# Patient Record
Sex: Male | Born: 1995 | Race: White | Hispanic: No | Marital: Single | Smoking: Never smoker
Health system: Southern US, Community
[De-identification: ages and names within clinical notes are randomized; demographics above are authoritative.]

---
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
## 2016-05-18 ENCOUNTER — Other Ambulatory Visit: Payer: Self-pay | Admitting: Student

## 2016-05-18 DIAGNOSIS — K824 Cholesterolosis of gallbladder: Secondary | ICD-10-CM

## 2016-05-26 ENCOUNTER — Ambulatory Visit: Admission: RE | Admit: 2016-05-26 | Source: Ambulatory Visit

## 2016-05-30 IMAGING — US US ABDOMEN LIMITED
1 series · 14 of 25 positions shown · non-contrast
Comparison: 05/17/2015

CLINICAL DATA: Elevated liver enzymes with right upper quadrant
pain

EXAM:
US ABDOMEN LIMITED - RIGHT UPPER QUADRANT

[Series 1: us abdomen limited · 0.17mm/px · 14 of 58 slices shown]
[im 1/58]
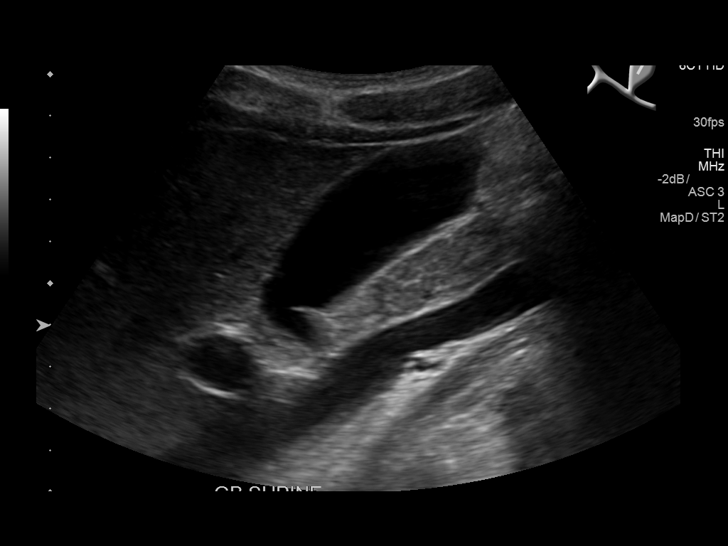
[im 5/58]
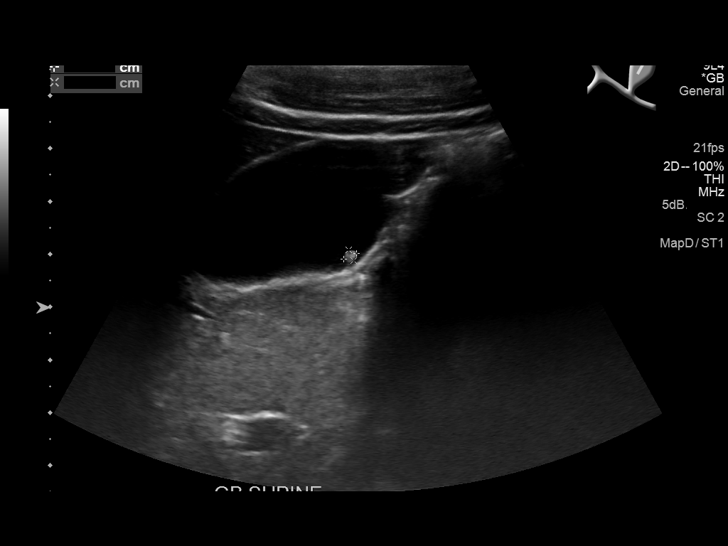
[im 10/58]
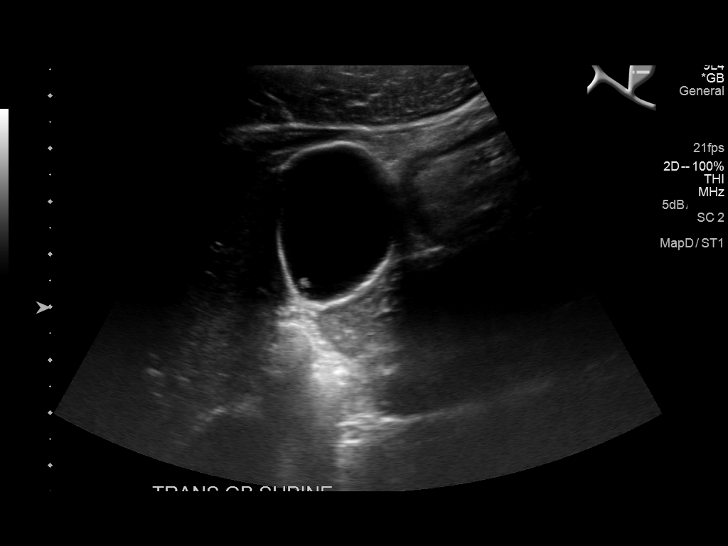
[im 15/58]
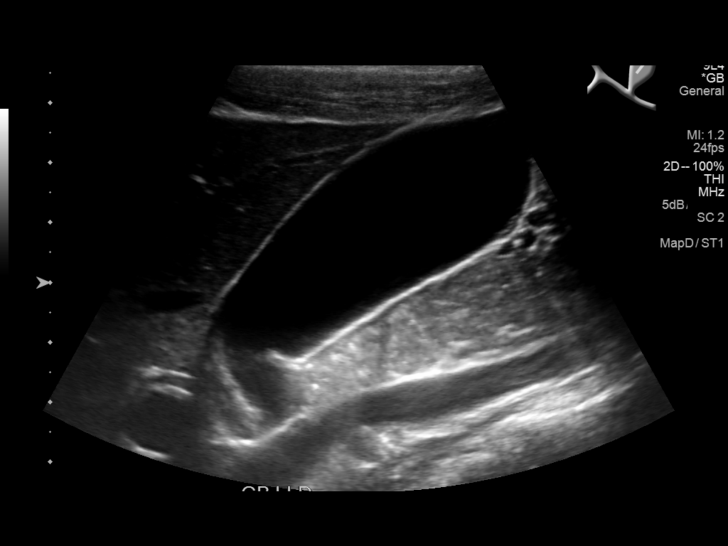
[im 20/58]
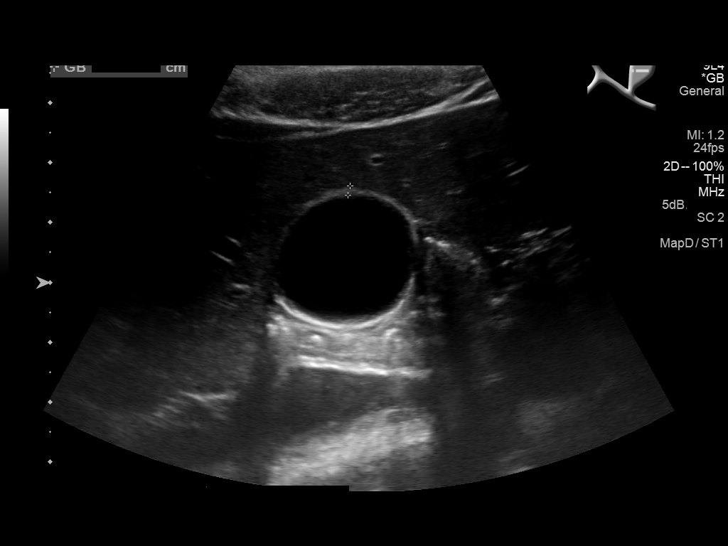
[im 22/58]
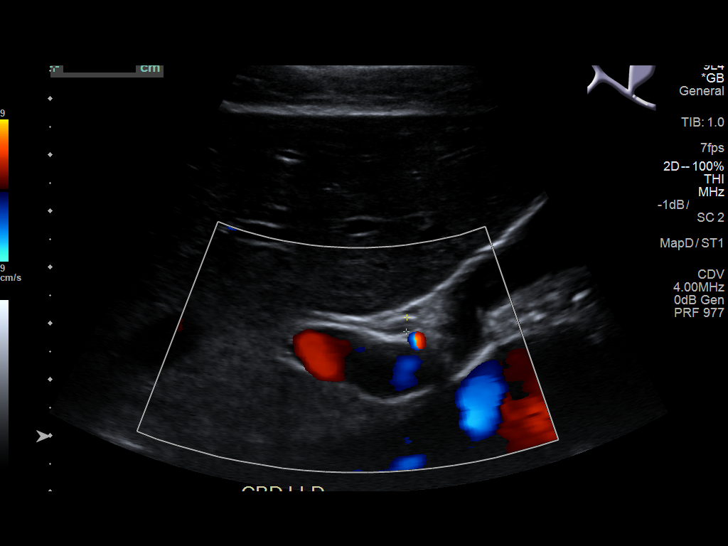
[im 27/58]
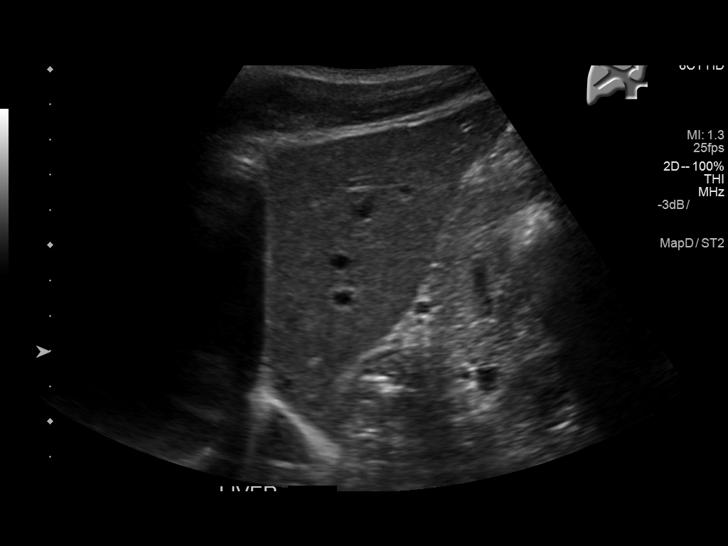
[im 31/58]
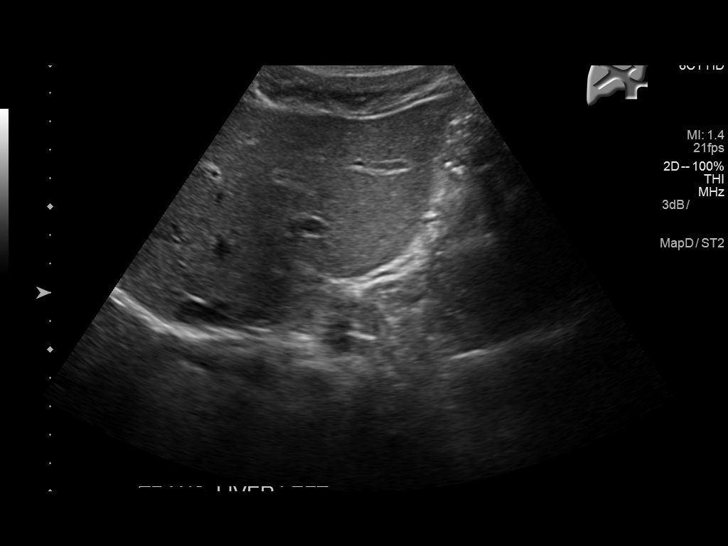
[im 36/58]
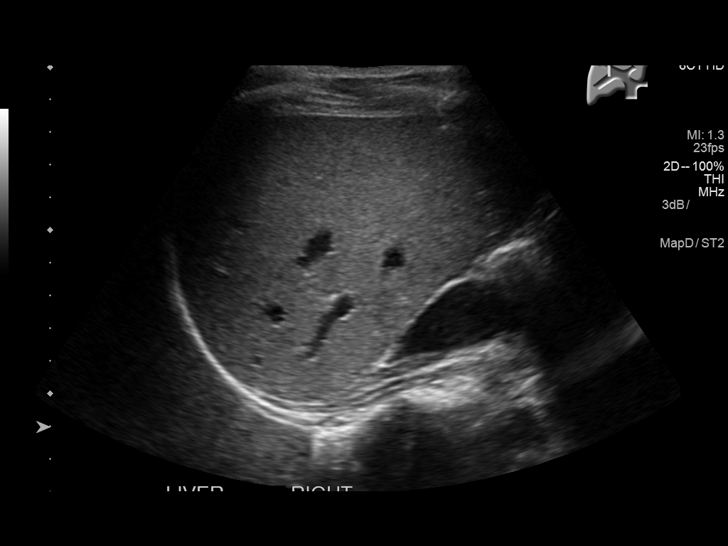
[im 39/58]
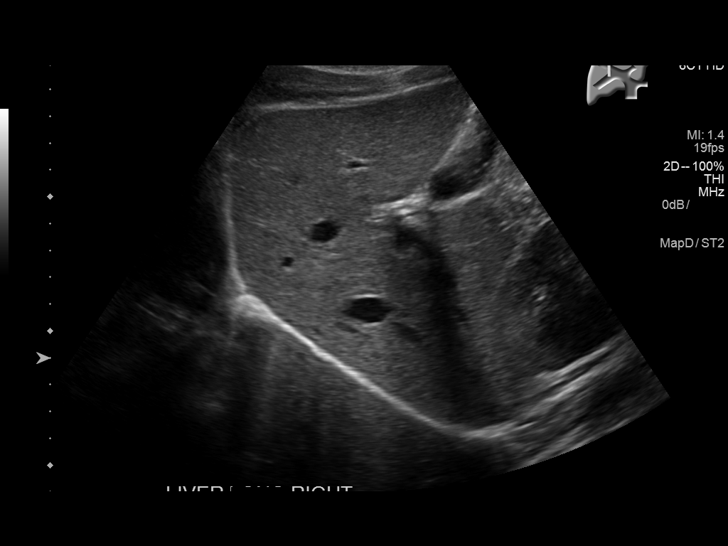
[im 43/58]
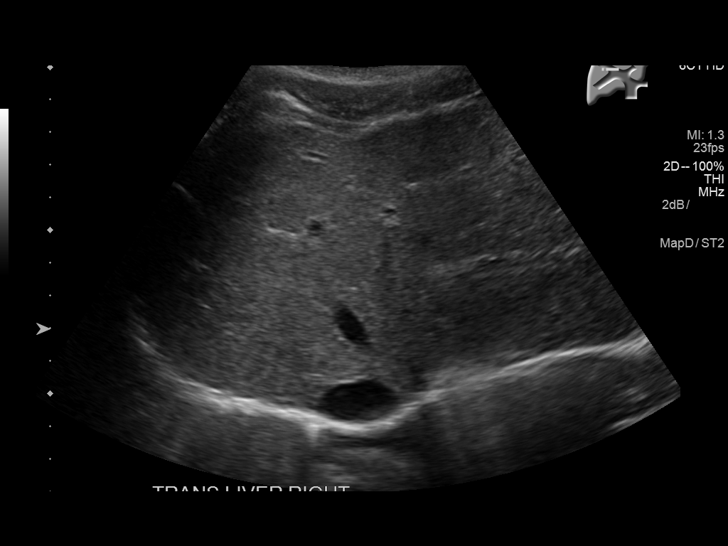
[im 48/58]
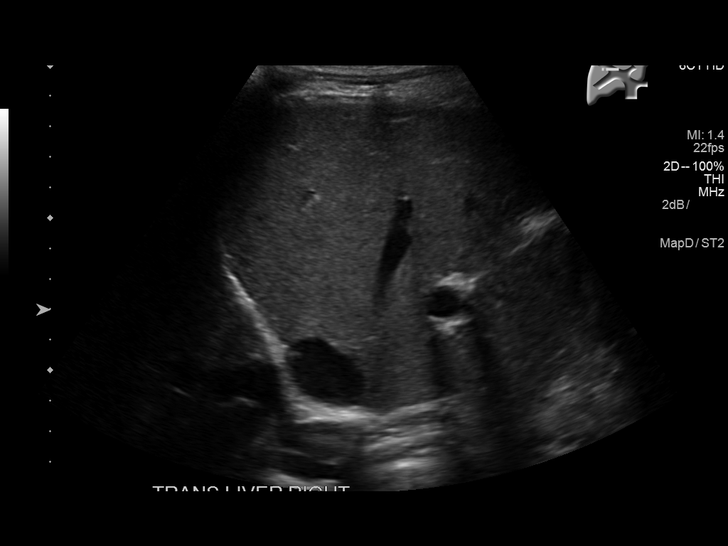
[im 53/58]
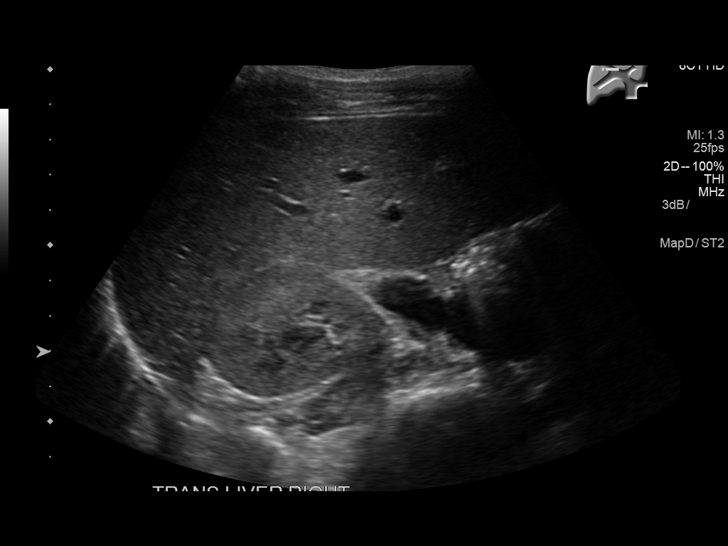
[im 58/58]
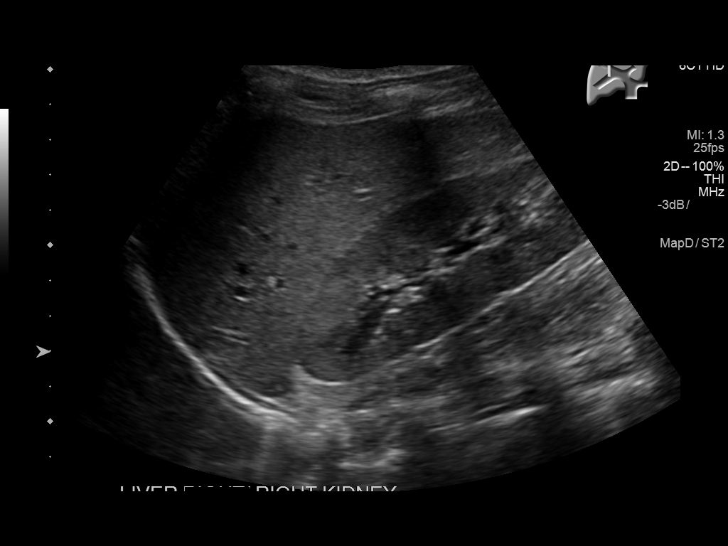

[14 of 25 positions shown; findings below may reference images not displayed]

FINDINGS: Gallbladder:

Small gallbladder polyp is again identified and stable. No wall
thickening or pericholecystic fluid is noted. No calculi are seen.

Common bile duct:

Diameter: 2.5 mm.

Liver:

No focal lesion identified. Within normal limits in parenchymal
echogenicity.
IMPRESSION: Stable gallbladder polyp.  No acute abnormality is noted.

## 2016-12-18 ENCOUNTER — Encounter: Payer: Self-pay | Admitting: Emergency Medicine

## 2016-12-18 ENCOUNTER — Emergency Department
Admission: EM | Admit: 2016-12-18 | Discharge: 2016-12-18 | Disposition: A | Discharge status: Home / Self Care | Attending: Emergency Medicine | Admitting: Emergency Medicine

## 2016-12-18 DIAGNOSIS — T675XXA Heat exhaustion, unspecified, initial encounter: Secondary | ICD-10-CM

## 2016-12-18 DIAGNOSIS — R1110 Vomiting, unspecified: Secondary | ICD-10-CM | POA: Insufficient documentation

## 2016-12-18 DIAGNOSIS — R197 Diarrhea, unspecified: Secondary | ICD-10-CM | POA: Diagnosis not present

## 2016-12-18 DIAGNOSIS — R112 Nausea with vomiting, unspecified: Secondary | ICD-10-CM

## 2016-12-18 LAB — COMPREHENSIVE METABOLIC PANEL
ALT: 15 U/L — AB (ref 17–63)
AST: 24 U/L (ref 15–41)
Albumin: 5.1 g/dL — ABNORMAL HIGH (ref 3.5–5.0)
Alkaline Phosphatase: 69 U/L (ref 38–126)
Anion gap: 11 (ref 5–15)
BUN: 16 mg/dL (ref 6–20)
CHLORIDE: 101 mmol/L (ref 101–111)
CO2: 28 mmol/L (ref 22–32)
CREATININE: 0.93 mg/dL (ref 0.61–1.24)
Calcium: 9.7 mg/dL (ref 8.9–10.3)
GFR calc non Af Amer: >60 mL/min (ref >60)
Glucose, Bld: 111 mg/dL — ABNORMAL HIGH (ref 65–99)
Potassium: 4.5 mmol/L (ref 3.5–5.1)
SODIUM: 140 mmol/L (ref 135–145)
Total Bilirubin: 3.7 mg/dL — ABNORMAL HIGH (ref 0.3–1.2)
Total Protein: 8.1 g/dL (ref 6.5–8.1)

## 2016-12-18 LAB — CBC
HEMATOCRIT: 48.3 % (ref 40.0–52.0)
HEMOGLOBIN: 16.6 g/dL (ref 13.0–18.0)
MCH: 30.4 pg (ref 26.0–34.0)
MCHC: 34.4 g/dL (ref 32.0–36.0)
MCV: 88.5 fL (ref 80.0–100.0)
PLATELETS: 240 10*3/uL (ref 150–440)
RBC: 5.45 MIL/uL (ref 4.40–5.90)
RDW: 13.3 % (ref 11.5–14.5)
WBC: 13.8 10*3/uL — ABNORMAL HIGH (ref 3.8–10.6)

## 2016-12-18 LAB — LIPASE, BLOOD: LIPASE: 21 U/L (ref 11–51)

## 2016-12-18 MED ORDER — DICYCLOMINE HCL 20 MG PO TABS: 20.0000 mg | ORAL_TABLET | Freq: Three times a day (TID) | ORAL | 0 refills | Status: DC | PRN

## 2016-12-18 MED ORDER — FAMOTIDINE 20 MG PO TABS: 20.0000 mg | ORAL_TABLET | Freq: Two times a day (BID) | ORAL | 1 refills | Status: DC

## 2016-12-18 MED ORDER — ONDANSETRON 4 MG PO TBDP: 4.0000 mg | ORAL_TABLET | Freq: Three times a day (TID) | ORAL | 0 refills | Status: DC | PRN

## 2016-12-18 MED ORDER — SUCRALFATE 1 G PO TABS: 1.0000 g | ORAL_TABLET | Freq: Four times a day (QID) | ORAL | 1 refills | Status: AC

## 2016-12-18 MED ORDER — ONDANSETRON HCL 4 MG/2ML IJ SOLN
4.0000 mg | Freq: Once | INTRAMUSCULAR | Status: AC
  Administered 2016-12-18: 4 mg via INTRAVENOUS
  Filled 2016-12-18: qty 2

## 2016-12-18 MED ORDER — SODIUM CHLORIDE 0.9 % IV SOLN
Freq: Once | INTRAVENOUS | Status: AC
  Administered 2016-12-18: 22:00:00 via INTRAVENOUS

## 2016-12-18 NOTE — ED Triage Notes (Signed)
Pt arrived to the ED for complaints of NVD starting today after being working outside in the sun all day. Pt states that he is not able to keep anything down and having diarrhea. Pt is AOx4 in no apparent distress.

## 2016-12-18 NOTE — ED Provider Notes (Signed)
[facility] Emergency Department Provider Note       Time seen: ----------------------------------------- 10:08 PM on 12/18/2016 -----------------------------------------     I have reviewed the triage vital signs and the nursing notes.   HISTORY   Chief Complaint Emesis and Diarrhea    HPI Troy Hanson is a 21 y.o. male who presents to the ED for comparison nausea, vomiting and diarrhea that started today after being outside in the sun all day. 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 Patient states he feels dehydrated. Typically has these symptoms twice a week but it is worse than normal today. He has seen gastroenterology in the past but was not given a specific diagnosis.   History reviewed. No pertinent past medical history.  There are no active problems to display for this patient.   History reviewed. No pertinent surgical history.  Allergies Patient has no known allergies.  Social History Social History  Substance Use Topics  . Smoking status: Never Smoker  . Smokeless tobacco: Never Used  . Alcohol use No    Review of Systems Constitutional: Negative for fever. Eyes: Negative for vision changes ENT:  Negative for congestion, sore throat Cardiovascular: Negative for chest pain. Respiratory: Negative for shortness of breath. Gastrointestinal: Negative for abdominal pain,Positive for vomiting and diarrhea Genitourinary: Negative for dysuria. Musculoskeletal: Negative for back pain. Skin: Negative for rash. Neurological: Negative for headaches, focal weakness or numbness.  All systems negative/normal/unremarkable except as stated in the HPI  ____________________________________________   PHYSICAL EXAM:  VITAL SIGNS: ED Triage Vitals  Enc Vitals Group     BP 12/18/16 2120 126/79     Pulse Rate 12/18/16 2120 94     Resp 12/18/16 2120 18     Temp 12/18/16 2120  98.7 F (37.1 C)     Temp Source 12/18/16 2120 Oral     SpO2 12/18/16 2120 100 %     Weight 12/18/16 2121 108 lb (49 kg)     Height 12/18/16 2121 5\' 3"  (1.6 m)     Head Circumference --      Peak Flow --      Pain Score 12/18/16 2120 4     Pain Loc --      Pain Edu? --      Excl. in GC? --     Constitutional: Alert and oriented. Well appearing and in no distress. Eyes: Conjunctivae are normal. Normal extraocular movements. ENT   Head: Normocephalic and atraumatic.   Nose: No congestion/rhinnorhea.   Mouth/Throat: Mucous membranes are moist.   Neck: No stridor. Cardiovascular: Normal rate, regular rhythm. No murmurs, rubs, or gallops. Respiratory: Normal respiratory effort without tachypnea nor retractions. Breath sounds are clear and equal bilaterally. No wheezes/rales/rhonchi. Gastrointestinal: Soft and nontender. Normal bowel sounds Musculoskeletal: Nontender with normal range of motion in extremities. No lower extremity tenderness nor edema. Neurologic:  Normal speech and language. No gross focal neurologic deficits are appreciated.  Skin:  Skin is warm, dry and intact. No rash noted. Psychiatric: Mood and affect are normal. Speech and behavior are normal.  ____________________________________________  ED COURSE:  Pertinent labs & imaging results that were available during my care of the patient were reviewed by me and considered in my medical decision making (see chart for details). Patient presents for vomiting, diarrhea and possible heat related illness, we will assess with labs and imaging as indicated.   Procedures ____________________________________________   LABS (pertinent  positives/negatives)  Labs Reviewed  COMPREHENSIVE METABOLIC PANEL - Abnormal; Notable for the following:       Result Value   Glucose, Bld 111 (*)    Albumin 5.1 (*)    ALT 15 (*)    Total Bilirubin 3.7 (*)    All other components within normal limits  CBC - Abnormal; Notable  for the following:    WBC 13.8 (*)    All other components within normal limits  LIPASE, BLOOD  URINALYSIS, COMPLETE (UACMP) WITH MICROSCOPIC  ____________________________________________  FINAL ASSESSMENT AND PLAN  Vomiting and diarrhea, Sullivan LoneGilbert syndrome  Plan: Patient's labs were dictated above. Patient had presented for vomiting and diarrhea which is acute on chronic. He also likely had a heat-related illness. He is currently feeling better after fluids and Zofran. I will prescribe antiemetics, antacids and Carafate for him. He'll be referred to GI for outpatient follow-up.   Emily FilbertWilliams, Rethel Sebek E, MD   Note: This note was generated in part or whole with voice recognition software. Voice recognition is usually quite accurate but there are transcription errors that can and very often do occur. I apologize for any typographical errors that were not detected and corrected.     Emily FilbertWilliams, Shannelle Alguire E, MD 12/18/16 2300

## 2016-12-18 NOTE — ED Notes (Signed)
NAD noted at time of D/C. Pt denies questions or concerns. Pt ambulatory to the lobby at this time.  

## 2017-02-12 ENCOUNTER — Ambulatory Visit (INDEPENDENT_AMBULATORY_CARE_PROVIDER_SITE_OTHER): Admitting: Gastroenterology

## 2017-02-12 ENCOUNTER — Encounter: Payer: Self-pay | Admitting: Gastroenterology

## 2017-02-12 ENCOUNTER — Other Ambulatory Visit: Payer: Self-pay

## 2017-02-12 VITALS — BP 108/65 | HR 78 | Temp 98.4°F | Ht 64.0 in | Wt 109.0 lb

## 2017-02-12 DIAGNOSIS — K824 Cholesterolosis of gallbladder: Secondary | ICD-10-CM | POA: Diagnosis not present

## 2017-02-12 DIAGNOSIS — R112 Nausea with vomiting, unspecified: Secondary | ICD-10-CM | POA: Diagnosis not present

## 2017-02-12 MED ORDER — FAMOTIDINE 20 MG PO TABS: 20.0000 mg | ORAL_TABLET | Freq: Two times a day (BID) | ORAL | 6 refills | Status: DC

## 2017-02-12 MED ORDER — HYOSCYAMINE SULFATE 0.125 MG SL SUBL: 0.1250 mg | SUBLINGUAL_TABLET | SUBLINGUAL | 2 refills | Status: DC | PRN

## 2017-02-12 NOTE — Progress Notes (Signed)
Gastroenterology Consultation  Referring Provider:     Dorothey Baseman, MD Primary Care Physician:  Dorothey Baseman, MD Primary Gastroenterologist:  Dr. Servando Snare     Reason for Consultation:     Nausea and vomiting        HPI:   Troy Hanson is a 21 y.o. y/o male referred for consultation & management of Nausea and vomiting by Dr. Dorothey Baseman, MD.  This patient comes in today with a history of nausea and vomiting.  The patient was seen at [facility] in the past. The patient states that he was started on dicyclomine at that time and it just made his stomach hurt worse.  The patient was recently in the ER and treated with Pepcid Zofran and Carafate.  The patient states that he has taken the Zofran twice since February 2 and does not believe that the Carafate is helping him at all.  He states that he believes that the Pepcid is helping him the most.  His symptoms are intermittent and not related to anything he eats or drinks.  The patient also reports that it is much worse with stress.  There is no report of any black stools or bloody stools.  He also denies any vomiting up of blood.  There is no report of any constipation or diarrhea.   History reviewed. No pertinent past medical history.  History reviewed. No pertinent surgical history.  Prior to Admission medications   Medication Sig Start Date End Date Taking? Authorizing Provider  famotidine (PEPCID) 20 MG tablet Take 1 tablet (20 mg total) by mouth 2 (two) times daily. 02/12/17  Yes Midge Minium, MD  sucralfate (CARAFATE) 1 g tablet Take 1 tablet (1 g total) by mouth 4 (four) times daily. 12/18/16 12/18/17 Yes Emily Filbert, MD  dicyclomine (BENTYL) 20 MG tablet Take 1 tablet (20 mg total) by mouth 3 (three) times daily as needed for spasms. Patient not taking: Reported on 02/12/2017 12/18/16   Emily Filbert, MD  hyoscyamine (LEVSIN SL) 0.125 MG SL tablet Place 1 tablet (0.125 mg total) under the tongue every 4 (four) hours as  needed. 02/12/17   Midge Minium, MD  ondansetron (ZOFRAN ODT) 4 MG disintegrating tablet Take 1 tablet (4 mg total) by mouth every 8 (eight) hours as needed for nausea or vomiting. Patient not taking: Reported on 02/12/2017 12/18/16   Emily Filbert, MD    History reviewed. No pertinent family history.   Social History  Substance Use Topics  . Smoking status: Never Smoker  . Smokeless tobacco: Never Used  . Alcohol use No    Allergies as of 02/12/2017  . (No Known Allergies)    Review of Systems:    All systems reviewed and negative except where noted in HPI.   Physical Exam:  BP 108/65   Pulse 78   Temp 98.4 F (36.9 C) (Oral)   Ht 5\' 4"  (1.626 m)   Wt 109 lb (49.4 kg)   BMI 18.71 kg/m  No LMP for male patient. Psych:  Alert and cooperative. Normal mood and affect. General:   Alert,  Well-developed, well-nourished, pleasant and cooperative in NAD Head:  Normocephalic and atraumatic. Eyes:  Sclera clear, no icterus.   Conjunctiva pink. Ears:  Normal auditory acuity. Nose:  No deformity, discharge, or lesions. Mouth:  No deformity or lesions,oropharynx pink & moist. Neck:  Supple; no masses or thyromegaly. Lungs:  Respirations even and unlabored.  Clear throughout to auscultation.  No wheezes, crackles, or rhonchi. No acute distress. Heart:  Regular rate and rhythm; no murmurs, clicks, rubs, or gallops. Abdomen:  Normal bowel sounds.  No bruits.  Soft, non-tender and non-distended without masses, hepatosplenomegaly or hernias noted.  No guarding or rebound tenderness.  Negative Carnett sign.   Rectal:  Deferred.  Msk:  Symmetrical without gross deformities.  Good, equal movement & strength bilaterally. Pulses:  Normal pulses noted. Extremities:  No clubbing or edema.  No cyanosis. Neurologic:  Alert and oriented x3;  grossly normal neurologically. Skin:  Intact without significant lesions or rashes.  No jaundice. Lymph Nodes:  No significant cervical  adenopathy. Psych:  Alert and cooperative. Normal mood and affect.  Imaging Studies: No results found.  Assessment and Plan:   Troy Hanson is a 21 y.o. y/o male who comes in with nausea and vomiting and symptoms consistent with possible reflux and irritable bowel syndrome.  The patient has felt better with Pepcid and his prescription will be refilled.  The patient will stop his Carafate because he states it is not helping him.  The patient is unable to take dicyclomine because he states it hurts his stomach.  The patient will be started on hyoscyamine 0.125 mg sublingually as needed.  He has been told that if this does not help then he may need to be started on desipramine daily at bedtime.  The patient will also have a repeat ultrasound due to a history of a gallbladder polyp. The patient has been explained the plan and agrees with it.  Midge Minium, MD. Clementeen Graham   Note: This dictation was prepared with Dragon dictation along with smaller phrase technology. Any transcriptional errors that result from this process are unintentional.

## 2017-02-12 NOTE — Patient Instructions (Signed)
You are scheduled for a RUQ abdominal US at [facility] on Tuesday, Sept 4th at 11:00am. Please arrive at 10:45am. You cannot have anything to eat or drink after midnight Monday night.  If you need to reschedule this appointment for any reason, please contact central scheduling at [phone].

## 2017-02-20 ENCOUNTER — Ambulatory Visit
Admission: RE | Admit: 2017-02-20 | Discharge: 2017-02-20 | Disposition: A | Discharge status: Home / Self Care | Source: Ambulatory Visit | Attending: Gastroenterology | Admitting: Gastroenterology

## 2017-02-20 DIAGNOSIS — K824 Cholesterolosis of gallbladder: Secondary | ICD-10-CM | POA: Diagnosis not present

## 2017-02-21 ENCOUNTER — Telehealth: Payer: Self-pay

## 2017-02-21 NOTE — Telephone Encounter (Signed)
-----   Message from Midge Miniumarren Wohl, MD sent at 02/21/2017  8:49 AM EDT ----- Let the patient know that the gallbladder polyp has not changed in size and is stable.

## 2017-02-21 NOTE — Telephone Encounter (Signed)
Pt notified of US results.  

## 2017-11-26 IMAGING — US US ABDOMEN LIMITED
1 series · 14 of 25 positions shown · non-contrast
Comparison: Ultrasound August 25, 2015.

CLINICAL DATA: Gallbladder polyp.

EXAM:
ULTRASOUND ABDOMEN LIMITED RIGHT UPPER QUADRANT

[Series 1: us abdomen limited · 0.13mm/px · 14 of 52 slices shown]
[im 1/52]
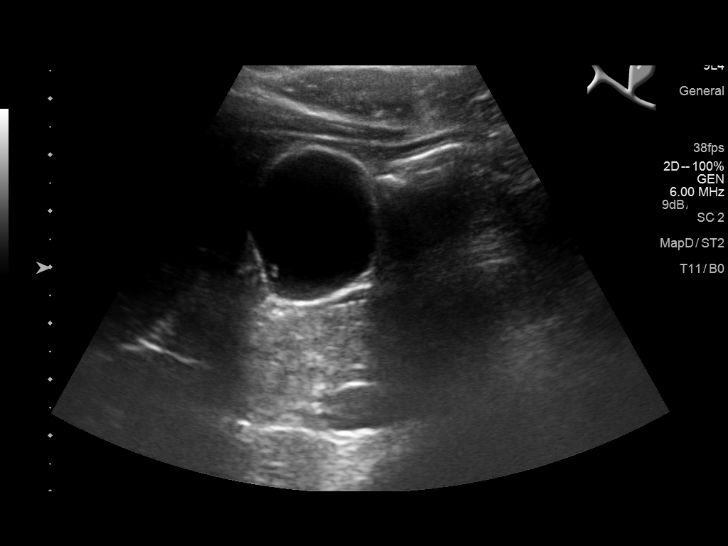
[im 5/52]
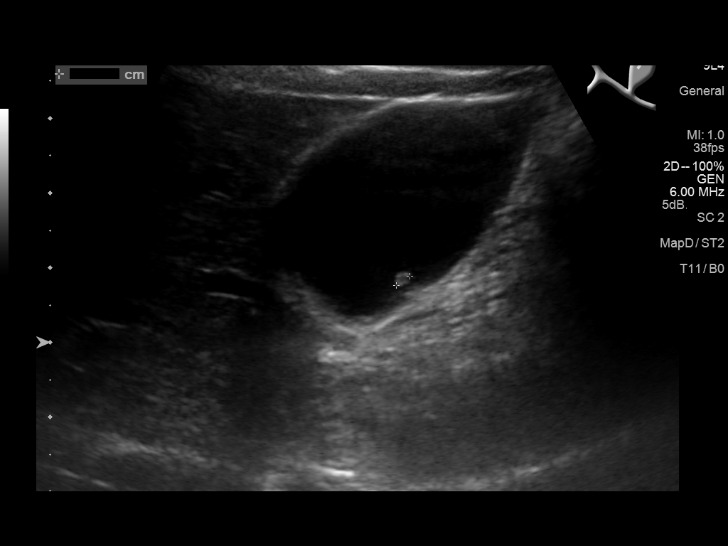
[im 9/52]
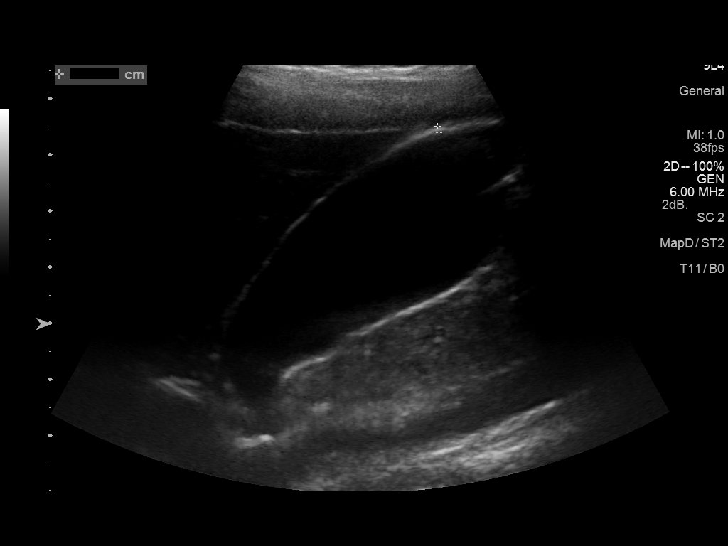
[im 13/52]
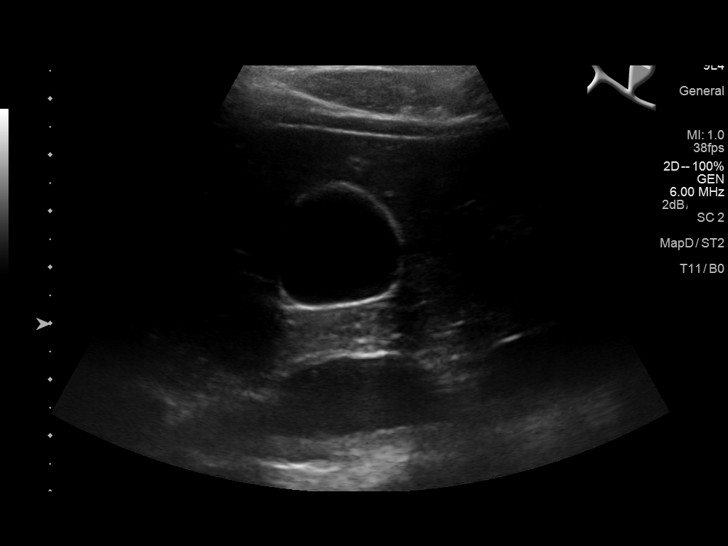
[im 18/52]
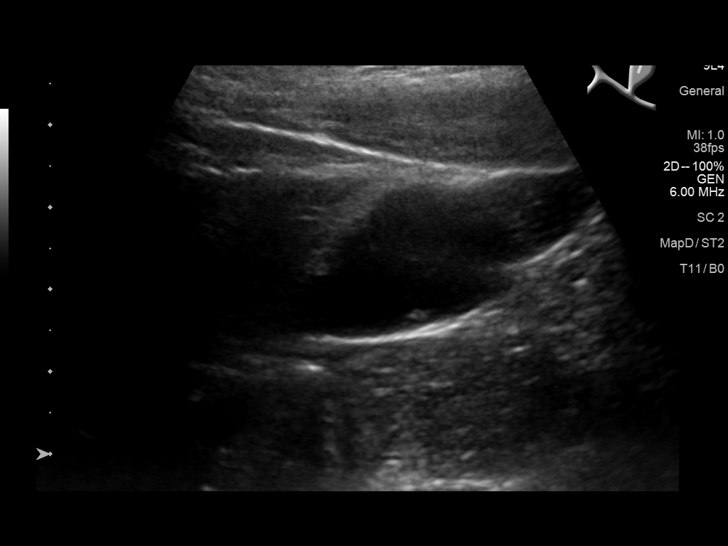
[im 20/52]
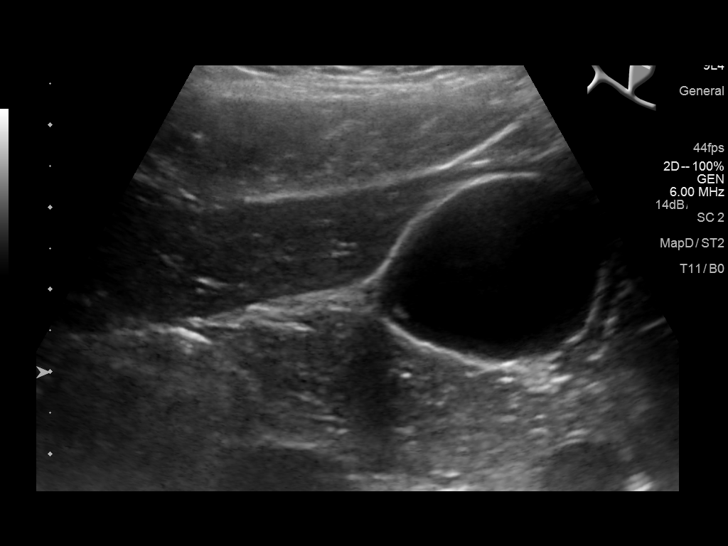
[im 24/52]
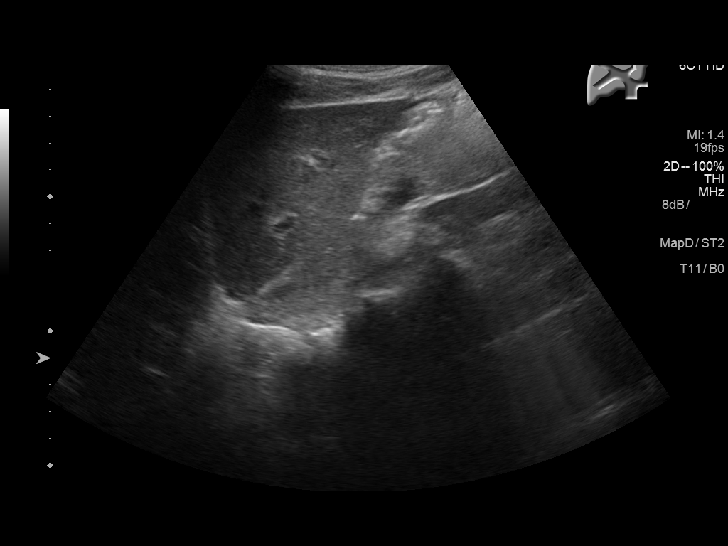
[im 28/52]
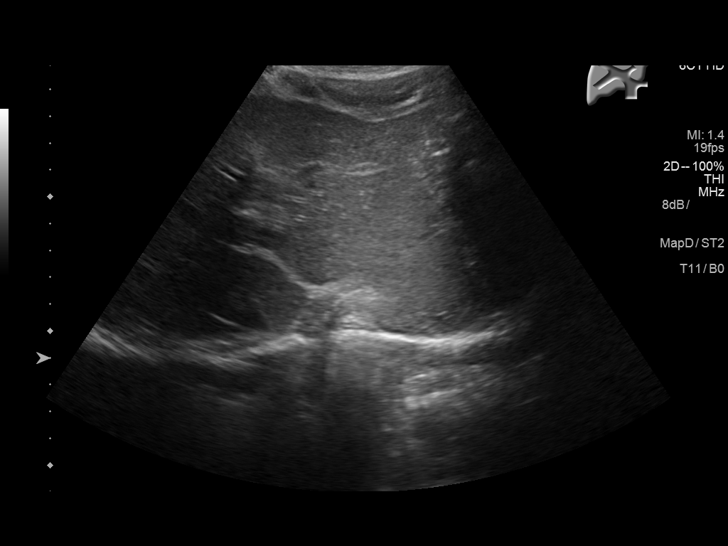
[im 32/52]
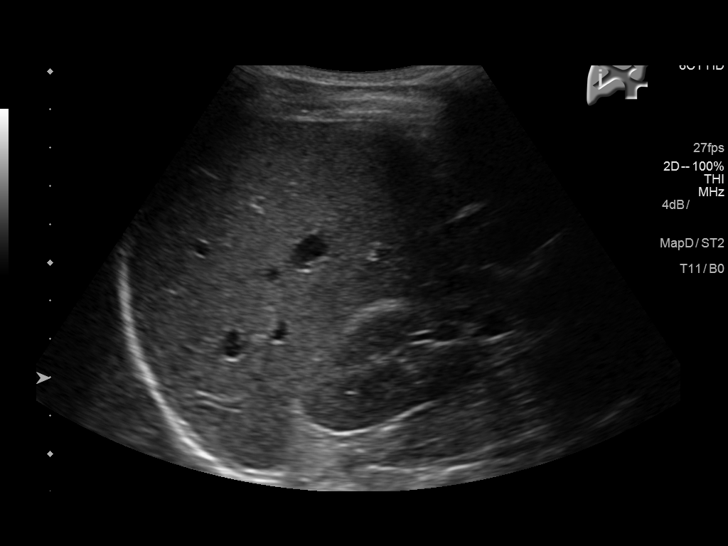
[im 35/52]
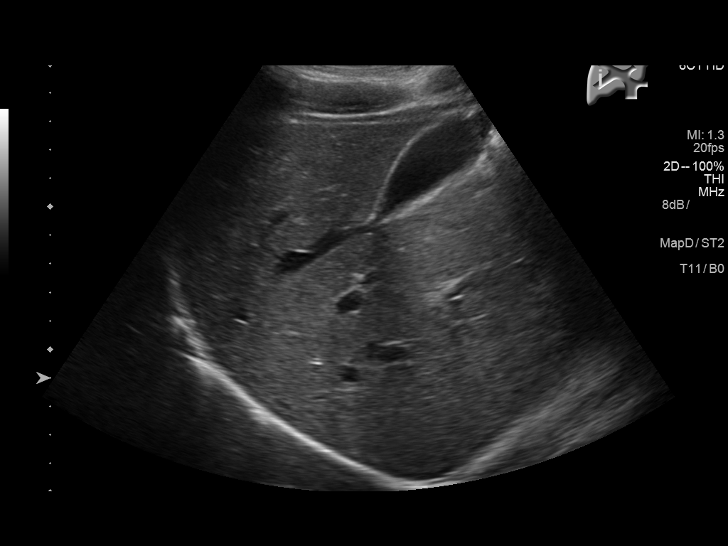
[im 39/52]
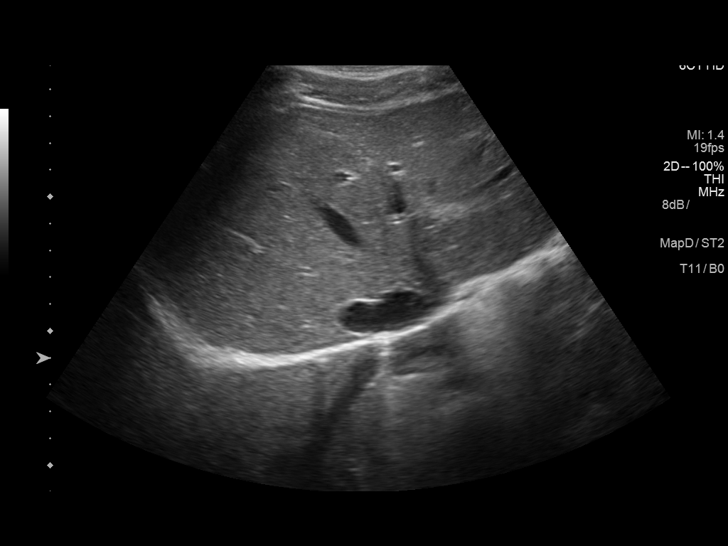
[im 43/52]
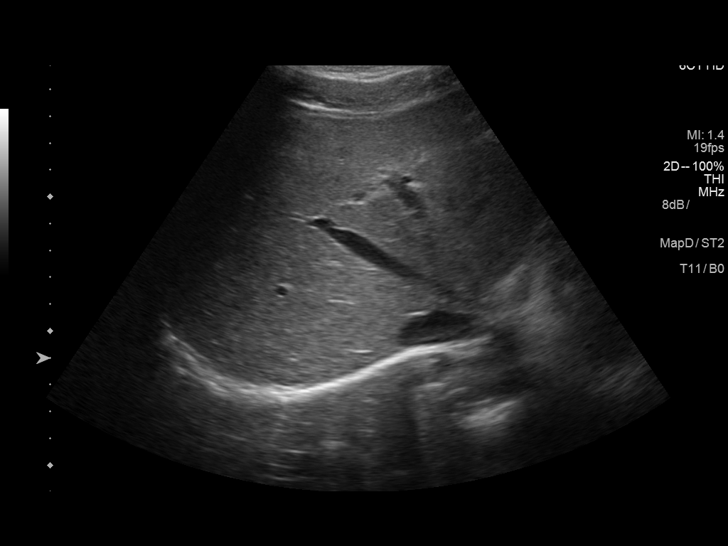
[im 47/52]
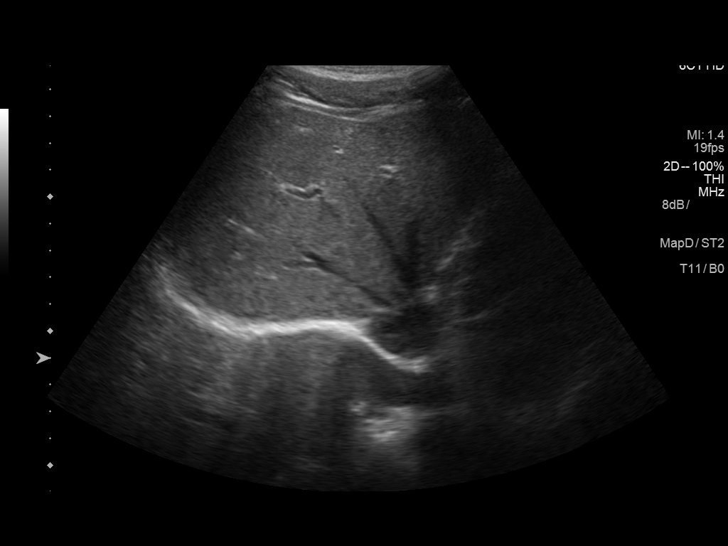
[im 52/52]
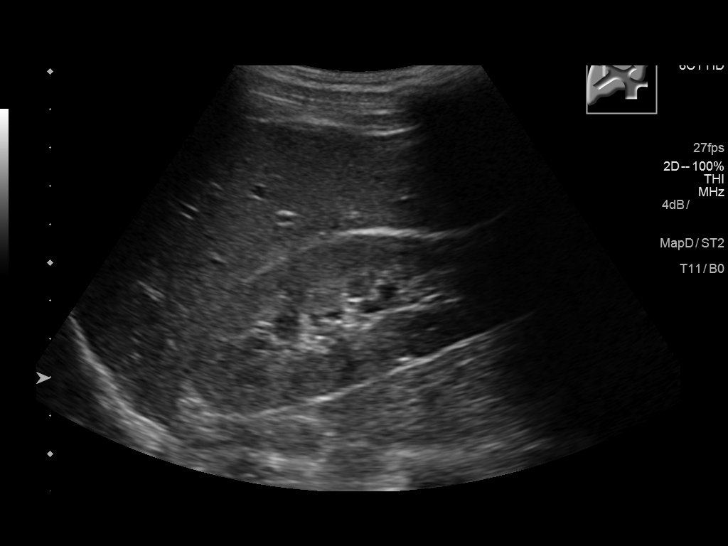

[14 of 25 positions shown; findings below may reference images not displayed]

FINDINGS: Gallbladder:

No gallstones or wall thickening visualized. No sonographic Murphy
sign noted by sonographer. Stable 3 mm gallbladder polyp is noted.

Common bile duct:

Diameter: 3.5 mm which is within normal limits.

Liver:

No focal lesion identified. Within normal limits in parenchymal
echogenicity. Portal vein is patent on color Doppler imaging with
normal direction of blood flow towards the liver.
IMPRESSION: Stable small gallbladder polyp is noted. No other abnormality seen
in the right upper quadrant of the abdomen.

## 2023-05-31 ENCOUNTER — Telehealth: Payer: Self-pay | Admitting: Gastroenterology

## 2023-05-31 NOTE — Telephone Encounter (Signed)
Patient called and is requesting to make an appointment with [place]. Patient does have GI History with [facility]. I did advise the patient that we would be needed to do a transfer of care and we will be needing those records from previous GI.

## 2023-06-07 ENCOUNTER — Encounter: Payer: Self-pay | Admitting: Gastroenterology

## 2023-06-07 ENCOUNTER — Ambulatory Visit: Payer: Managed Care, Other (non HMO) | Admitting: Gastroenterology

## 2023-06-07 VITALS — BP 122/72 | Wt 122.0 lb

## 2023-06-07 DIAGNOSIS — R197 Diarrhea, unspecified: Secondary | ICD-10-CM | POA: Diagnosis not present

## 2023-06-07 DIAGNOSIS — R112 Nausea with vomiting, unspecified: Secondary | ICD-10-CM

## 2023-06-07 DIAGNOSIS — K580 Irritable bowel syndrome with diarrhea: Secondary | ICD-10-CM

## 2023-06-07 NOTE — Patient Instructions (Signed)
You have been scheduled for an endoscopy. Please follow written instructions given to you at your visit today.  If you use inhalers (even only as needed), please bring them with you on the day of your procedure.  If you take any of the following medications, they will need to be adjusted prior to your procedure:   DO NOT TAKE 7 DAYS PRIOR TO TEST- Trulicity (dulaglutide) Ozempic, Wegovy (semaglutide) Mounjaro (tirzepatide) Bydureon Bcise (exanatide extended release)  DO NOT TAKE 1 DAY PRIOR TO YOUR TEST Rybelsus (semaglutide) Adlyxin (lixisenatide) Victoza (liraglutide) Byetta (exanatide) ___________________________________________________________________________   _______________________________________________________  If your blood pressure at your visit was 140/90 or greater, please contact your primary care physician to follow up on this.  _______________________________________________________  If you are age 17 or older, your body mass index should be between 23-30. Your Body mass index is 20.94 kg/m. If this is out of the aforementioned range listed, please consider follow up with your Primary Care Provider.  If you are age 70 or younger, your body mass index should be between 19-25. Your Body mass index is 20.94 kg/m. If this is out of the aformentioned range listed, please consider follow up with your Primary Care Provider.   ________________________________________________________  The [facility] providers would like to encourage you to use [facility] to communicate with providers for non-urgent requests or questions.  Due to long hold times on the telephone, sending your provider a message by [facility] may be a faster and more efficient way to get a response.  Please allow 48 business hours for a response.  Please remember that this is for non-urgent requests.   It was a pleasure to see you today!  Thank you for trusting me with your gastrointestinal care!    Scott  E.Tomasa Rand, MD

## 2023-06-07 NOTE — Progress Notes (Signed)
Discussed the use of AI scribe software for clinical note transcription with the patient, who gave verbal consent to proceed.  HPI : Troy Hanson is a 27 y.o. male with no significant past medical history who is referred to [place] by Dorothey Baseman, MD for further evaluation of chronic nausea and vomiting.  He states the symptoms first began in high school and were initially sporadic, but have now become a daily or every other day occurrence for the past six months to a year. The patient describes a typical episode as waking up feeling well, but then experiencing intense nausea and vomiting after taking a shower.  Interestingly, the patient denies having problems with nausea vomiting in the mornings when he does not go to work (weekends, holidays).  The vomitus is typically bile and phlegm. The patient also reports concurrent episodes of explosive diarrhea, which he is unsure if it is a lingering effect from a recent medication, sertraline, which he stopped taking about one to two weeks ago.  The patient notes that the nausea and vomiting are not consistent with food intake, as sometimes he can eat certain foods without issue, while other times the same food will trigger an episode. He describes a feeling of bloating and a sense of knowing when he needs to vomit to feel better. There is no associated pain prior to vomiting, but he does report occasional stomach pain that is associated with diarrhea.  Although he sometimes has nausea and vomiting after meals, this is most reliably noted in the mornings after showering, as mentioned above.  The patient also reports a sensation of food sitting in his stomach after eating, frequent belching, and flatulence. He has also experienced instances of fecal incontinence, where he is unable to distinguish between the need to pass gas or have a bowel movement.  The patient has a history of taking various medications for these symptoms, including Levsin, Bentyl,  Pepcid, Zofran, and Carafate, but does not recall any significant improvement with these treatments. He recently started taking citalopram.  The patient's weight has fluctuated recently due to changes in his exercise routine, but he has generally maintained around 120 pounds. He has a family history of similar gastrointestinal symptoms in his father. The patient works as a Marine scientist and does not consider his job to be particularly stressful.   He does admit to some symptoms of anxiety/irritability.  He did not tolerate Zoloft well and was started on Celexa today.  He was previously seen by GI in [place] in 2016 and 2018.  He he underwent a right upper quadrant ultrasound which was normal except for a small gallbladder polyp.  Repeat ultrasound a year later showed stability of the small polyp.  He has never undergone upper endoscopy.  Right upper quadrant ultrasound February 20, 2017 IMPRESSION: Stable small gallbladder polyp is noted. No other abnormality seen in the right upper quadrant of the abdomen.  Right upper quadrant ultrasound August 25, 2015 IMPRESSION: Stable gallbladder polyp.  No acute abnormality is noted.  Right upper quadrant ultrasound May 17, 2015 IMPRESSION: Tiny gallbladder polyp exam otherwise unremarkable.  History reviewed. No pertinent past medical history.   History reviewed. No pertinent surgical history. History reviewed. No pertinent family history. Social History   Tobacco Use   Smoking status: Never   Smokeless tobacco: Never  Substance Use Topics   Alcohol use: No   Drug use: No   Current Outpatient Medications  Medication Sig Dispense Refill   dicyclomine (BENTYL)  20 MG tablet Take 1 tablet (20 mg total) by mouth 3 (three) times daily as needed for spasms. (Patient not taking: Reported on 06/07/2023) 20 tablet 0   famotidine (PEPCID) 20 MG tablet Take 1 tablet (20 mg total) by mouth 2 (two) times daily. (Patient not taking: Reported on  06/07/2023) 60 tablet 6   hyoscyamine (LEVSIN SL) 0.125 MG SL tablet Place 1 tablet (0.125 mg total) under the tongue every 4 (four) hours as needed. (Patient not taking: Reported on 06/07/2023) 30 tablet 2   ondansetron (ZOFRAN ODT) 4 MG disintegrating tablet Take 1 tablet (4 mg total) by mouth every 8 (eight) hours as needed for nausea or vomiting. (Patient not taking: Reported on 06/07/2023) 20 tablet 0   sucralfate (CARAFATE) 1 g tablet Take 1 tablet (1 g total) by mouth 4 (four) times daily. 120 tablet 1   No current facility-administered medications for this visit.   No Known Allergies   Review of Systems: All systems reviewed and negative except where noted in HPI.    No results found.  Physical Exam: BP 122/72   Wt 122 lb (55.3 kg)   BMI 20.94 kg/m  Constitutional: Pleasant,well-developed, Caucasian male in no acute distress. HEENT: Normocephalic and atraumatic. Conjunctivae are normal. No scleral icterus. Neck supple.  Cardiovascular: Normal rate, regular rhythm.  Pulmonary/chest: Effort normal and breath sounds normal. No wheezing, rales or rhonchi. Abdominal: Soft, nondistended, nontender. Bowel sounds active throughout. There are no masses palpable. No hepatomegaly. Extremities: no edema Neurological: Alert and oriented to person place and time. Skin: Skin is warm and dry. No rashes noted. Psychiatric: Normal mood and affect. Behavior is normal.  CBC    Component Value Date/Time   WBC 13.8 (H) 12/18/2016 2130   RBC 5.45 12/18/2016 2130   HGB 16.6 12/18/2016 2130   HCT 48.3 12/18/2016 2130   PLT 240 12/18/2016 2130   MCV 88.5 12/18/2016 2130   MCH 30.4 12/18/2016 2130   MCHC 34.4 12/18/2016 2130   RDW 13.3 12/18/2016 2130    CMP     Component Value Date/Time   NA 140 12/18/2016 2130   K 4.5 12/18/2016 2130   CL 101 12/18/2016 2130   CO2 28 12/18/2016 2130   GLUCOSE 111 (H) 12/18/2016 2130   BUN 16 12/18/2016 2130   CREATININE 0.93 12/18/2016 2130    CALCIUM 9.7 12/18/2016 2130   PROT 8.1 12/18/2016 2130   ALBUMIN 5.1 (H) 12/18/2016 2130   AST 24 12/18/2016 2130   ALT 15 (L) 12/18/2016 2130   ALKPHOS 69 12/18/2016 2130   BILITOT 3.7 (H) 12/18/2016 2130   GFRNONAA >60 12/18/2016 2130   GFRAA >60 12/18/2016 2130       Latest Ref Rng & Units 12/18/2016    9:30 PM  CBC EXTENDED  WBC 3.8 - 10.6 K/uL 13.8   RBC 4.40 - 5.90 MIL/uL 5.45   Hemoglobin 13.0 - 18.0 g/dL 16.1   HCT 09.6 - 04.5 % 48.3   Platelets 150 - 440 K/uL 240       ASSESSMENT AND PLAN:  27 year old male with longstanding history of nausea and vomiting, recently increased in frequency and severity.  No red flag symptoms.  Being treated with Celexa by PCP for anxiety.  Although GERD can cause nausea and vomiting in the mornings, this is typically more upon awakening.  This patient feels fine upon awakening, but typically has his nausea vomiting episodes after showering in the morning.  Additionally, he notes that he does  not have any problems with nausea vomiting typically on weekends or holidays when he is not working.  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 pylori infection and celiac.  Chronic Nausea and Vomiting: Longstanding history of intermittent nausea and vomiting, now daily or every other day for the past 6 months to a year. Symptoms are worse in the morning after showering. No significant relief with previous medications including Zoloft, Levsin, Bentyl, Pepcid, Zofran, and Carafate.  We discussed the pathophysiology of gut brain axis disorders and the complex relationship between the brain and the GI tract.  He was recently started on Celexa, which I am hopeful will help with his anxiety, and thus with his GI symptoms as well.  Although a TCA or Wellbutrin may be helpful for symptoms, would not recommend starting 1 of these medications at this  time given the recent initiation of Celexa.  We can consider adding 1 of these medications or switching to one of these medications at a later date if his symptoms are not improving with Celexa. -Schedule upper endoscopy to evaluate for potential causes such as stricture, ulcers, or esophageal damage. -Take biopsies during endoscopy to check for Helicobacter pylori infection and celiac disease.  Gastrointestinal Urgency and Diarrhea, likely IBS: Reports of explosive diarrhea and urgency, with some normal bowel movements.  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  Previous use of Zoloft may have exacerbated symptoms, which have improved since discontinuation. -Assess response of symptoms to Celexa -Follow-up after upper endoscopy to further discuss possible dietary modifications to improve symptoms such as low FODMAP diet.  Jood Retana E. Tomasa Rand, MD [facility]  I spent a total of 40 minutes reviewing the patient's medical record, interviewing and examining the patient, discussing his diagnosis and management of his condition going forward, and documenting in the medical record        Dorothey Baseman, MD

## 2023-07-02 ENCOUNTER — Encounter: Payer: Self-pay | Admitting: Gastroenterology

## 2023-07-09 ENCOUNTER — Encounter: Payer: Self-pay | Admitting: Gastroenterology

## 2023-07-09 ENCOUNTER — Ambulatory Visit (AMBULATORY_SURGERY_CENTER): Payer: Managed Care, Other (non HMO) | Admitting: Gastroenterology

## 2023-07-09 VITALS — BP 111/69 | HR 65 | Temp 98.1°F | Resp 15 | Ht 64.0 in | Wt 122.0 lb

## 2023-07-09 DIAGNOSIS — K319 Disease of stomach and duodenum, unspecified: Secondary | ICD-10-CM | POA: Diagnosis present

## 2023-07-09 DIAGNOSIS — K31A19 Gastric intestinal metaplasia without dysplasia, unspecified site: Secondary | ICD-10-CM | POA: Diagnosis not present

## 2023-07-09 DIAGNOSIS — R112 Nausea with vomiting, unspecified: Secondary | ICD-10-CM

## 2023-07-09 MED ORDER — SODIUM CHLORIDE 0.9 % IV SOLN: 500.0000 mL | Freq: Once | INTRAVENOUS | Status: DC

## 2023-07-09 NOTE — Patient Instructions (Signed)
Please read handouts provided. Continue present medications. Await pathology results. Follow up as needed in clinic for ongoing management of chronic nausea/vomiting.   YOU HAD AN ENDOSCOPIC PROCEDURE TODAY AT THE [facility]:   Refer to the procedure report that was given to you for any specific questions about what was found during the examination.  If the procedure report does not answer your questions, please call your gastroenterologist to clarify.  If you requested that your care partner not be given the details of your procedure findings, then the procedure report has been included in a sealed envelope for you to review at your convenience later.  YOU SHOULD EXPECT: Some feelings of bloating in the abdomen. Passage of more gas than usual.  Walking can help get rid of the air that was put into your GI tract during the procedure and reduce the bloating. If you had a lower endoscopy (such as a colonoscopy or flexible sigmoidoscopy) you may notice spotting of blood in your stool or on the toilet paper. If you underwent a bowel prep for your procedure, you may not have a normal bowel movement for a few days.  Please Note:  You might notice some irritation and congestion in your nose or some drainage.  This is from the oxygen used during your procedure.  There is no need for concern and it should clear up in a day or so.  SYMPTOMS TO REPORT IMMEDIATELY:  Following upper endoscopy (EGD)  Vomiting of blood or coffee ground material  New chest pain or pain under the shoulder blades  Painful or persistently difficult swallowing  New shortness of breath  Fever of 100F or higher  Black, tarry-looking stools  For urgent or emergent issues, a gastroenterologist can be reached at any hour by calling [phone]. Do not use MyChart messaging for urgent concerns.    DIET:  We do recommend a small meal at first, but then you may proceed to your regular diet.  Drink plenty of fluids  but you should avoid alcoholic beverages for 24 hours.  ACTIVITY:  You should plan to take it easy for the rest of today and you should NOT DRIVE or use heavy machinery until tomorrow (because of the sedation medicines used during the test).    FOLLOW UP: Our staff will call the number listed on your records the next business day following your procedure.  We will call around 7:15- 8:00 am to check on you and address any questions or concerns that you may have regarding the information given to you following your procedure. If we do not reach you, we will leave a message.     If any biopsies were taken you will be contacted by phone or by letter within the next 1-3 weeks.  Please call us at [phone] if you have not heard about the biopsies in 3 weeks.    SIGNATURES/CONFIDENTIALITY: You and/or your care partner have signed paperwork which will be entered into your electronic medical record.  These signatures attest to the fact that that the information above on your After Visit Summary has been reviewed and is understood.  Full responsibility of the confidentiality of this discharge information lies with you and/or your care-partner.

## 2023-07-09 NOTE — Progress Notes (Signed)
Called to room to assist during endoscopic procedure.  Patient ID and intended procedure confirmed with present staff. Received instructions for my participation in the procedure from the performing physician.  

## 2023-07-09 NOTE — Progress Notes (Signed)
Pt's states no medical or surgical changes since previsit or office visit. 

## 2023-07-09 NOTE — Progress Notes (Signed)
Sedate, gd SR, tolerated procedure well, VSS, report to RN 

## 2023-07-09 NOTE — Progress Notes (Signed)
[facility] History and Physical   Primary Care Physician:  Dorothey Baseman, MD   Reason for Procedure:   Nausea, vomiting  Plan:    EGD     HPI: Troy Hanson is a 28 y.o. male undergoing EGD to evaluate chronic nausea and vomiting, not responsive to PPI.  Symptoms about the same currently as when he saw me in December.    History reviewed. No pertinent past medical history.  History reviewed. No pertinent surgical history.  Prior to Admission medications   Medication Sig Start Date End Date Taking? Authorizing Provider  citalopram (CELEXA) 10 MG tablet Take by mouth. 07/04/23  Yes [provider]  sucralfate (CARAFATE) 1 g tablet Take 1 tablet (1 g total) by mouth 4 (four) times daily. Patient not taking: Reported on 07/09/2023 12/18/16 12/18/17  Emily Filbert, MD    Current Outpatient Medications  Medication Sig Dispense Refill   citalopram (CELEXA) 10 MG tablet Take by mouth.     sucralfate (CARAFATE) 1 g tablet Take 1 tablet (1 g total) by mouth 4 (four) times daily. (Patient not taking: Reported on 07/09/2023) 120 tablet 1   Current Facility-Administered Medications  Medication Dose Route Frequency Provider Last Rate Last Admin   0.9 %  sodium chloride infusion  500 mL Intravenous Once Jenel Lucks, MD        Allergies as of 07/09/2023   (No Known Allergies)    History reviewed. No pertinent family history.  Social History   Socioeconomic History   Marital status: Single    Spouse name: Not on file   Number of children: Not on file   Years of education: Not on file   Highest education level: Not on file  Occupational History   Not on file  Tobacco Use   Smoking status: Never   Smokeless tobacco: Never  Substance and Sexual Activity   Alcohol use: Yes    Comment: occassionally   Drug use: No   Sexual activity: Yes  Other Topics Concern   Not on file  Social History Narrative   Not on file   Social Drivers of Health    Financial Resource Strain: Not on file  Food Insecurity: Not on file  Transportation Needs: Not on file  Physical Activity: Not on file  Stress: Not on file  Social Connections: Not on file  Intimate Partner Violence: Not on file    Review of Systems:  All other review of systems negative except as mentioned in the HPI.  Physical Exam: Vital signs BP 121/74   Pulse 77   Temp 98.1 F (36.7 C) (Temporal)   Ht 5\' 4"  (1.626 m)   Wt 122 lb (55.3 kg)   SpO2 99%   BMI 20.94 kg/m   General:   Alert,  Well-developed, well-nourished, pleasant and cooperative in NAD Airway:  Mallampati 1 Lungs:  Clear throughout to auscultation.   Heart:  Regular rate and rhythm; no murmurs, clicks, rubs,  or gallops. Abdomen:  Soft, nontender and nondistended. Normal bowel sounds.   Neuro/Psych:  Normal mood and affect. A and O x 3   Jearld Hemp E. Tomasa Rand, MD [facility]

## 2023-07-09 NOTE — Op Note (Signed)
[facility] Patient Name: Troy Hanson Procedure Date: 07/09/2023 9:44 AM MRN: [id number] Endoscopist: Lorin Picket E. Tomasa Rand , MD, [id number] Age: 28 Referring MD:  Date of Birth: Oct 22, 1995 Gender: Male Account #: [id number] Procedure:                Upper GI endoscopy Indications:              Nausea with vomiting Medicines:                Monitored Anesthesia Care Procedure:                Pre-Anesthesia Assessment:                           - Prior to the procedure, a History and Physical                            was performed, and patient medications and                            allergies were reviewed. The patient's tolerance of                            previous anesthesia was also reviewed. The risks                            and benefits of the procedure and the sedation                            options and risks were discussed with the patient.                            All questions were answered, and informed consent                            was obtained. Prior Anticoagulants: The patient has                            taken no anticoagulant or antiplatelet agents. ASA                            Grade Assessment: I - A normal, healthy patient.                            After reviewing the risks and benefits, the patient                            was deemed in satisfactory condition to undergo the                            procedure.                           After obtaining informed consent, the endoscope was  passed under direct vision. Throughout the                            procedure, the patient's blood pressure, pulse, and                            oxygen saturations were monitored continuously. The                            GIF [id number] #[id number] was introduced through the                            mouth, and advanced to the second part of duodenum.                            The upper GI endoscopy was accomplished  without                            difficulty. The patient tolerated the procedure                            well. Scope In: Scope Out: Findings:                 The examined portions of the nasopharynx,                            oropharynx and larynx were normal.                           The examined esophagus was normal.                           The entire examined stomach was normal. Biopsies                            were taken with a cold forceps for Helicobacter                            pylori testing. Estimated blood loss was minimal.                           The examined duodenum was normal. Complications:            No immediate complications. Estimated Blood Loss:     Estimated blood loss was minimal. Impression:               - The examined portions of the nasopharynx,                            oropharynx and larynx were normal.                           - Normal esophagus.                           - Normal  stomach. Biopsied.                           - Normal examined duodenum. Recommendation:           - Patient has a contact number available for                            emergencies. The signs and symptoms of potential                            delayed complications were discussed with the                            patient. Return to normal activities tomorrow.                            Written discharge instructions were provided to the                            patient.                           - Resume previous diet.                           - Continue present medications.                           - Await pathology results.                           - Follow up as needed in clinic for ongoing                            management of chronic nausea/vomiting. Marinell Igarashi E. Tomasa Rand, MD 07/09/2023 10:06:32 AM This report has been signed electronically.

## 2023-07-10 ENCOUNTER — Telehealth: Payer: Self-pay | Admitting: *Deleted

## 2023-07-10 NOTE — Telephone Encounter (Signed)
  Follow up Call-     07/09/2023    9:30 AM  Call back number  Post procedure Call Back phone  # [phone]  Permission to leave phone message Yes     Patient questions:     Message left to call us if necessary.

## 2023-07-11 ENCOUNTER — Encounter: Payer: Self-pay | Admitting: Gastroenterology

## 2023-07-11 LAB — SURGICAL PATHOLOGY

## 2023-07-12 ENCOUNTER — Telehealth: Payer: Self-pay | Admitting: Gastroenterology

## 2023-07-12 NOTE — Telephone Encounter (Signed)
Patient called stated he had a procedure on 07/09/2023, is requesting to speak with a nurse stating he is having slight abdominal pain after eating. Please advise

## 2023-07-12 NOTE — Telephone Encounter (Signed)
Pt was looking at his EGD report and states he read that he may have light pain after the procedure. PT states the day after his EGD he started having some abdominal discomfort after he eats. Reports it comes and goes. The pain is mostly on his left side near his belt line. Pt wanted to know if he should still be having this discomfort. Please advise.

## 2023-07-13 ENCOUNTER — Encounter: Payer: Self-pay | Admitting: Gastroenterology

## 2023-07-13 ENCOUNTER — Other Ambulatory Visit: Payer: Self-pay

## 2023-07-13 MED ORDER — AMITRIPTYLINE HCL 10 MG PO TABS: 10.0000 mg | ORAL_TABLET | Freq: Every day | ORAL | 1 refills | Status: DC

## 2023-07-13 NOTE — Telephone Encounter (Signed)
Spoke with pt and he is aware of Dr. Milas Hock recommendations. Script for [employer] sent to pharmacy.   Pt wants to know if he should continue taking his celexa or stop it. Please advise.

## 2023-07-13 NOTE — Telephone Encounter (Signed)
Spoke with pt and he is aware of Dr. Milas Hock comments. Pt states that Dr. Tomasa Rand had mentioned starting a different medication if he did not improve on the celexa. Reports he has not had as much nausea but he has had some diarrhea. Reports the nausea comes in waves. He is not sure if he has taken it long enough or if he needs a different medication. Please advise.

## 2023-07-17 NOTE — Telephone Encounter (Signed)
Pt notified via mychart

## 2023-08-07 ENCOUNTER — Other Ambulatory Visit: Payer: Self-pay | Admitting: Gastroenterology

## 2023-10-12 ENCOUNTER — Other Ambulatory Visit: Payer: Self-pay | Admitting: Gastroenterology

## 2023-10-18 ENCOUNTER — Encounter: Payer: Self-pay | Admitting: Gastroenterology
